# Patient Record
Sex: Male | Born: 1960 | Race: White | Hispanic: No | Marital: Single | Smoking: Current every day smoker
Health system: Southern US, Community
[De-identification: ages and names within clinical notes are randomized; demographics above are authoritative.]

## PROBLEM LIST (undated history)

## (undated) DIAGNOSIS — I219 Acute myocardial infarction, unspecified: Secondary | ICD-10-CM

---

## 2013-07-20 ENCOUNTER — Emergency Department (HOSPITAL_COMMUNITY)
Admission: EM | Admit: 2013-07-20 | Discharge: 2013-07-20 | Disposition: A | Discharge status: Home / Self Care | Payer: Worker's Compensation | Attending: Emergency Medicine | Admitting: Emergency Medicine

## 2013-07-20 ENCOUNTER — Emergency Department (HOSPITAL_COMMUNITY): Payer: Worker's Compensation

## 2013-07-20 ENCOUNTER — Encounter (HOSPITAL_COMMUNITY): Payer: Self-pay | Admitting: Emergency Medicine

## 2013-07-20 DIAGNOSIS — Y9289 Other specified places as the place of occurrence of the external cause: Secondary | ICD-10-CM | POA: Insufficient documentation

## 2013-07-20 DIAGNOSIS — F17200 Nicotine dependence, unspecified, uncomplicated: Secondary | ICD-10-CM | POA: Insufficient documentation

## 2013-07-20 DIAGNOSIS — M940 Chondrocostal junction syndrome [Tietze]: Secondary | ICD-10-CM | POA: Insufficient documentation

## 2013-07-20 DIAGNOSIS — X500XXA Overexertion from strenuous movement or load, initial encounter: Secondary | ICD-10-CM | POA: Insufficient documentation

## 2013-07-20 DIAGNOSIS — Y9389 Activity, other specified: Secondary | ICD-10-CM | POA: Insufficient documentation

## 2013-07-20 MED ORDER — NAPROXEN 500 MG PO TABS: 500.0000 mg | ORAL_TABLET | Freq: Two times a day (BID) | ORAL | Status: DC

## 2013-07-20 MED ORDER — METHOCARBAMOL 500 MG PO TABS: 500.0000 mg | ORAL_TABLET | Freq: Two times a day (BID) | ORAL | Status: DC

## 2013-07-20 NOTE — ED Notes (Signed)
Per pt sts that he lifts heavy boxes at work and since Saturday he has been having pain and swelling in left rib area. sts hurts when he moves and breathes.

## 2013-07-20 NOTE — ED Notes (Signed)
Family at bedside. 

## 2013-07-20 NOTE — ED Notes (Signed)
Declined W/C at D/C and was escorted to lobby by RN. 

## 2013-07-20 NOTE — ED Notes (Signed)
Patient transported to CT 

## 2013-07-20 NOTE — ED Provider Notes (Signed)
Medical screening examination/treatment/procedure(s) were performed by non-physician practitioner and as supervising physician I was immediately available for consultation/collaboration.   EKG Interpretation None        Blanchard Kelch, MD 07/20/13 1926

## 2013-07-20 NOTE — ED Notes (Signed)
Patient to ED with C/O left rib pain.  States that he was doing some heavy lifting at work and began having rib pain.  Patient indicate that the pain is in his left anterior chest at the 5th intercostal space.  Patient states that the pain worsens when he takes a deep breath or when he turns to the left.

## 2013-07-20 NOTE — ED Provider Notes (Signed)
CSN: [id number]     Arrival date & time 07/20/13  1407 History  This chart was scribed for non-physician practitioner, Domenic Moras, PA-C working with Blanchard Kelch, MD by Frederich Balding, ED scribe. This patient was seen in room TR07C/TR07C and the patient's care was started at 3:06 PM.   Chief Complaint  Patient presents with  . Rib Injury   The history is provided by the patient. No language interpreter was used.   HPI Comments: Carlos Haley is a 53 y.o. male who presents to the Emergency Department complaining of sudden onset left rib pain that started 2 days ago. States he was lifting heavy boxes at work when the pain started. Denies specific injury. Reports he noticed some swelling around his ribs but states it has resolved. Sitting down, palpation and deep breathing worsen the pain. Pt has taken leftover tylenol #3 from a tooth extraction and states it provided little relief. Denies cough, hemoptysis, SOB.   History reviewed. No pertinent past medical history. History reviewed. No pertinent past surgical history. History reviewed. No pertinent family history. History  Substance Use Topics  . Smoking status: Current Every Day Smoker  . Smokeless tobacco: Not on file  . Alcohol Use: Yes    Review of Systems  Respiratory: Negative for cough and shortness of breath.   Musculoskeletal:       Left rib pain.   All other systems reviewed and are negative.  Allergies  Review of patient's allergies indicates no known allergies.  Home Medications   Prior to Admission medications   Not on File   BP 102/67  Pulse 74  Temp(Src) 98.7 F (37.1 C)  Resp 18  Wt 180 lb (81.647 kg)  SpO2 98%  Physical Exam  Nursing note and vitals reviewed. Constitutional: He is oriented to person, place, and time. He appears well-developed and well-nourished. No distress.  HENT:  Head: Normocephalic and atraumatic.  Eyes: Conjunctivae and EOM are normal.  Neck: Neck supple. No tracheal  deviation present.  Cardiovascular: Normal rate, regular rhythm and normal heart sounds.   Pulmonary/Chest: Effort normal and breath sounds normal. No respiratory distress. He has no wheezes. He has no rales.  Left costochondral tenderness to palpation. No crepitus. No emphysema.  Musculoskeletal: Normal range of motion.  Lipoma to left mid back.   Neurological: He is alert and oriented to person, place, and time.  Skin: Skin is warm and dry.  Mild skin irritation noted to the anterior chest and abdomen.  Psychiatric: He has a normal mood and affect. His behavior is normal.    ED Course  Procedures (including critical care time)  DIAGNOSTIC STUDIES: Oxygen Saturation is 98% on RA, normal by my interpretation.    COORDINATION OF CARE: 3:09 PM-Discussed treatment plan which includes xray with pt at bedside and pt agreed to plan.   3:47 PM Xray of L ribs are without acute finding.  Pain likely costochondritis given his presenting history.  Doubt PE, PNA, pericarditis, or other acute emergent condition.  Pt is PERC negative.  Will d/c with NSAIDs, muscle relaxant and RICE therapy.    Labs Review Labs Reviewed - No data to display  Imaging Review Dg Ribs Unilateral W/chest Left  07/20/2013   CLINICAL DATA:  RIB INJURY  EXAM: LEFT RIBS AND CHEST - 3+ VIEW  COMPARISON:  None available  FINDINGS: The cardiac and mediastinal silhouettes are stable in size and contour, and remain within normal limits.  The lungs are normally inflated. Oblique  linear opacity within the right lung base is most consistent with atelectasis and/ scarring. No airspace consolidation, pleural effusion, or pulmonary edema is identified. There is no pneumothorax.  Metallic BB marker present within the lower left chest, presumably marking the area of injury/pain. No acute rib fracture identified.  IMPRESSION: 1. No acute rib fracture. 2. No other acute cardiopulmonary abnormality.   Electronically Signed   By: Jeannine Boga M.D.   On: 07/20/2013 15:39     EKG Interpretation None      MDM   Final diagnoses:  Costochondritis, acute    BP 102/67  Pulse 74  Temp(Src) 98.7 F (37.1 C)  Resp 18  Wt 180 lb (81.647 kg)  SpO2 98%  I have reviewed nursing notes and vital signs. I personally reviewed the imaging tests through PACS system  I reviewed available ER/hospitalization records thought the EMR   I personally performed the services described in this documentation, which was scribed in my presence. The recorded information has been reviewed and is accurate.  Domenic Moras, PA-C 07/20/13 1550

## 2013-07-20 NOTE — Discharge Instructions (Signed)
Costochondritis Costochondritis, sometimes called Tietze syndrome, is a swelling and irritation (inflammation) of the tissue (cartilage) that connects your ribs with your breastbone (sternum). It causes pain in the chest and rib area. Costochondritis usually goes away on its own over time. It can take up to 6 weeks or longer to get better, especially if you are unable to limit your activities. CAUSES  Some cases of costochondritis have no known cause. Possible causes include:  Injury (trauma).  Exercise or activity such as lifting.  Severe coughing. SIGNS AND SYMPTOMS  Pain and tenderness in the chest and rib area.  Pain that gets worse when coughing or taking deep breaths.  Pain that gets worse with specific movements. DIAGNOSIS  Your health care provider will do a physical exam and ask about your symptoms. Chest X-rays or other tests may be done to rule out other problems. TREATMENT  Costochondritis usually goes away on its own over time. Your health care provider may prescribe medicine to help relieve pain. HOME CARE INSTRUCTIONS   Avoid exhausting physical activity. Try not to strain your ribs during normal activity. This would include any activities using chest, abdominal, and side muscles, especially if heavy weights are used.  Apply ice to the affected area for the first 2 days after the pain begins.  Put ice in a plastic bag.  Place a towel between your skin and the bag.  Leave the ice on for 20 minutes, 2-3 times a day.  Only take over-the-counter or prescription medicines as directed by your health care provider. SEEK MEDICAL CARE IF:  You have redness or swelling at the rib joints. These are signs of infection.  Your pain does not go away despite rest or medicine. SEEK IMMEDIATE MEDICAL CARE IF:   Your pain increases or you are very uncomfortable.  You have shortness of breath or difficulty breathing.  You cough up blood.  You have worse chest pains,  sweating, or vomiting.  You have a fever or persistent symptoms for more than 2-3 days.  You have a fever and your symptoms suddenly get worse. MAKE SURE YOU:   Understand these instructions.  Will watch your condition.  Will get help right away if you are not doing well or get worse. Document Released: 10/25/2004 Document Revised: 11/05/2012 Document Reviewed: 08/19/2012 [facility] Patient Information 2015 [place], [place]. This information is not intended to replace advice given to you by your health care provider. Make sure you discuss any questions you have with your health care provider.   Emergency Department Resource Guide 1) Find a Doctor and Pay Out of Pocket Although you won't have to find out who is covered by your insurance plan, it is a good idea to ask around and get recommendations. You will then need to call the office and see if the doctor you have chosen will accept you as a new patient and what types of options they offer for patients who are self-pay. Some doctors offer discounts or will set up payment plans for their patients who do not have insurance, but you will need to ask so you aren't surprised when you get to your appointment.  2) Contact Your Local Health Department Not all health departments have doctors that can see patients for sick visits, but many do, so it is worth a call to see if yours does. If you don't know where your local health department is, you can check in your phone book. The CDC also has a tool to help you  locate your state's health department, and many state websites also have listings of all of their local health departments.  3) Find a [facility] If your illness is not likely to be very severe or complicated, you may want to try a walk in clinic. These are popping up all over the country in pharmacies, drugstores, and shopping centers. They're usually staffed by nurse practitioners or physician assistants that have been trained to treat  common illnesses and complaints. They're usually fairly quick and inexpensive. However, if you have serious medical issues or chronic medical problems, these are probably not your best option.  No Primary Care Doctor: - Call Health Connect at  [phone] - they can help you locate a primary care doctor that  accepts your insurance, provides certain services, etc. - Physician Referral Service- [phone]  Chronic Pain Problems: Organization         Address  Phone   Notes  [facility]  [phone] Patients need to be referred by their primary care doctor.   Medication Assistance: Organization         Address  Phone   Notes  [facility] Medication [facility] [address] [place], [place] [address] [phone] --Must be a resident of [facility] -- Must have NO insurance coverage whatsoever (no Medicaid/ Medicare, etc.) -- The pt. MUST have a primary care doctor that directs their care regularly and follows them in the community   [facility]ist  [phone]   [employer]  [phone]    Agencies that provide inexpensive medical care: Organization         Address  Phone   Notes  [place]  [phone]   [facility]    [phone]   [facility] [place], [place] [address] [phone]   [place] [address], [place] [phone]   Planned Parenthood    [phone]   [facility]    [phone]   [place] and [place] [address], [place] Phone:  [phone], Fax:  [phone] Hours of Operation:  9 am - 6 pm, M-F.  Also accepts Medicaid/Medicare and self-pay.  [facility] for [address], [address], [place] Phone: [phone], Fax: [phone]. Hours of Operation:  8:30 am - 5:30 pm, M-F.  Also accepts Medicaid and self-pay.  [facility] [address], [place] Phone: [phone]   [place], [place], [place] [phone], Ext. [phone] Mondays & Thursdays: 7-9 AM.  First 15 patients are seen on a first come, first serve basis.    [place] Providers:  Organization         Address  Phone   Notes  [facility] [address], [place] [phone] Also accepts self-pay patients.  [facility] [address], [place]  [phone]   [place], [address], [place] [phone]   [facility] [address] [place] [phone]   Lucianne Lei [address], [place]   [phone] Only accepts [place] Access [place] patients after they have their name applied to their card.   Self-Pay (no insurance) in [facility]:  Organization  Address  Phone   Notes  Sickle Cell Patients, [facility] [place] [phone]   [facility] [place] [phone]   [facility] [place]  [place], [address], [place] [phone]   [facility]/Dr. Osei-Bonsu  [address] [place] or [address], [place] [phone] Phone number for both [place] and [place] locations is the same.  [facility]gent [facility] and [facility] [address], [place] [phone]   [facility] [address] [place] or [address] [phone]   [facility] [address] [place] [phone], phone; [phone], fax Sees patients 1st and 3rd Saturday of every month.  Must not qualify for public or private insurance (i.e. Medicaid, Medicare, [place] Health Choice, Veterans' Benefits)  Household income should be no more than 200% of the  poverty level The clinic cannot treat you if you are pregnant or think you are pregnant  Sexually transmitted diseases are not treated at the clinic.    Dental Care: Organization         Address  Phone  Notes  [facility] Department of [facility] [place] [phone] Accepts children up to age 61 who are enrolled in [place] or [place]; pregnant women with a Medicaid card; and children who have applied for Medicaid or [place] Health Choice, but were declined, whose parents can pay a reduced fee at time of service.  [facility] Department of [facility]  [address], [place] [phone] Accepts children up to age 65 who are enrolled in [place] or [place]; pregnant women with a Medicaid card; and children who have applied for Medicaid or [place] Health Choice, but were declined, whose parents can pay a reduced fee at time of service.  [place] Adult Dental Access PROGRAM  [place] [phone] Patients are seen by appointment only. Walk-ins are not accepted. [place] will see patients 42 years of age and older. Monday - Tuesday (8am-5pm) Most Wednesdays (8:30-5pm) $30 per visit, cash only  [facility] Adult Dental Access PROGRAM  [address], [facility] [phone] Patients are seen by appointment only. Walk-ins are not accepted. [place] will see patients 31 years of age and older. One Wednesday Evening (Monthly: Volunteer Based).  $30 per visit, cash only  [place]  [phone] for adults; Children under age 49, call Graduate Pediatric Dentistry at [phone]. Children aged 28-14, please call [phone] to request a pediatric application.  Dental services are provided in all areas of dental care including fillings, crowns and bridges, complete and partial dentures, implants, gum treatment, root canals, and extractions.  Preventive care is also provided. Treatment is provided to both adults and children. Patients are selected via a lottery and there is often a waiting list.   [facility] [address], [employer]  [phone] [email or web address]   [facility] [address] [place], [place] [phone], Ext. [phone] Second and Fourth Thursday of each month, opens at 6:30 AM; Clinic ends at 9 AM.  Patients are seen on a first-come first-served basis, and a limited number are seen during each clinic.   [facility]  [address] [place], [place]  [place], [place] [phone]   Eligibility Requirements You must have lived in [place], [place], or [place] counties for at least the last three months.   You cannot be eligible for state or federal sponsored [employer], including [employer], [place], or [employer].   You generally cannot be eligible for healthcare insurance through your employer.    How to apply: Eligibility screenings are held every Tuesday and Wednesday afternoon from 1:00 pm until 4:00 pm. You do not need an appointment for the interview!  [facility] [address], [place], [place]   [place]  [place] Department  [place]  [phone]    Behavioral Health Resources in the Community: Intensive Outpatient Programs Organization         Address  Phone  Notes  [place]. [address], [place], [place] [phone]   [facility] [address] [place], [place]   ADS: [facility] Drug [facility] [address] [place], [place]   [place] [address]  [place], [place] or [phone]   Substance Abuse Resources Organization         Address  Phone  Notes  Alcohol and Drug Services  [phone]   [place]  [phone]   [facility]   Chinita Pester  [phone]   Residential & Outpatient Substance Abuse Program  [phone]   Psychological Services Organization         Address  Phone  Notes  [facility] [place]  [place]  [phone]   [place] [address], [place] or [phone]    Mobile Crisis Teams Organization         Address  Phone  Notes  [facility], Mobile Crisis Care Unit  [phone]   [facility]  [address] [place], [place]   [place] [address], [place] [phone]    Self-Help/Support Groups Organization         Address  Phone             Notes  [place]. of [place] - variety of support groups  [place] Call for more information  Narcotics Anonymous (NA), [facility] [address], [employer] [place]  2 meetings at this location   Special educational needs teacher         Address  Phone  Notes  [facility]AP Residential Treatment [place],    [place]  [phone]-([phone]   [facility]  [address] [place] [address], [place], [place]   [place], [place] [phone] Admissions: 8am-3pm M-F  Incentives Substance [place] [address]    [place], [place] [phone]   [facility] [address] [place] [address] [place], [place]   [facility] [address],  [place], [place]   [facility] - Intensive Outpatient [address], [place], [place]   [facility] ([place].) [address].,  [place], [place] or [phone]   [facility] Services (RTS) [address] [place], [place] Accepts Medicaid  [facility]ship [place] [address]  [place]  [phone] Substance Abuse/Addiction Treatment   [facility] Resources Organization  Address  Phone  Notes  °[facility]  ([phone]) [phone]   °Julie Brannon, PhD [address] [place], [place]   ([phone]) [phone] or (336) 951-0000   °[facility]   [address] °[place], [place] ([phone]) [phone]   °[facility] [address], [place], [place] ([phone]) [phone] Insurance/Medicaid/sponsorship through [facility]  °[facility] [address]                                    [place], [place] [phone] Therapy/tele-psych/case  °[facility] [address]  ° [place], [place] ([phone]) [phone]    °Dr. Arfeen  ([phone]   °[facility] [place]  [employer] [place] [facility] 1) [address], [place] °2) [address], [place] °3)  [address], [place] ([phone]) [phone] °(336) [phone] ° °([phone]) [phone]   °[place] Child Abuse Hotline (336) 342-1394 or (336) [phone] (After Hours)    ° ° ° °

## 2015-10-18 ENCOUNTER — Encounter (HOSPITAL_COMMUNITY): Payer: Self-pay | Admitting: Emergency Medicine

## 2015-10-18 DIAGNOSIS — F17200 Nicotine dependence, unspecified, uncomplicated: Secondary | ICD-10-CM | POA: Diagnosis not present

## 2015-10-18 DIAGNOSIS — R21 Rash and other nonspecific skin eruption: Secondary | ICD-10-CM | POA: Diagnosis not present

## 2015-10-18 NOTE — ED Triage Notes (Signed)
Pt reports issues with flea and lice infestation at home, states treated hair and had house sprays, but noticed more bites to R forearm tonight. States tried everything but no relief.

## 2015-10-19 ENCOUNTER — Emergency Department (HOSPITAL_COMMUNITY)
Admission: EM | Admit: 2015-10-19 | Discharge: 2015-10-19 | Disposition: A | Discharge status: Home / Self Care | Payer: Managed Care, Other (non HMO) | Attending: Emergency Medicine | Admitting: Emergency Medicine

## 2015-10-19 DIAGNOSIS — R21 Rash and other nonspecific skin eruption: Secondary | ICD-10-CM

## 2015-10-19 MED ORDER — LORATADINE 10 MG PO TABS: 10.0000 mg | ORAL_TABLET | Freq: Every day | ORAL | 0 refills | Status: DC

## 2015-10-19 MED ORDER — TRIAMCINOLONE ACETONIDE 0.1 % EX CREA: 1.0000 "application " | TOPICAL_CREAM | Freq: Two times a day (BID) | CUTANEOUS | 0 refills | Status: AC

## 2015-10-19 NOTE — ED Provider Notes (Signed)
[facility] DEPT Provider Note   CSN: [id number] Arrival date & time: 10/18/15  2259     History   Chief Complaint Chief Complaint  Patient presents with  . Insect Bite    HPI Carlos Haley is a 55 y.o. male.  Carlos Haley is a 55 y.o. male presents to ED with complaint of rash. Patient reports issues with flea and lice infestation at home recently. He states the house was sprayed, dogs treated for fleas, bedding/clothing washed, and completed OTC lice treatment. He reports today to ED with white spots on his forearms b/l with associated itching and a sensation of "stuff moving" on his arms, onset today. Pt reports he has tried tea tree oil and alcohol without relief. Denies fever, purulent discharge, oral lesions, difficulty swallowing, shortness of breath, chest pain, N/V, or myalgias. No recent changes in lotion, soaps, or detergents. No recent new medications or medication changes. Reports girlfriend has similar sxs.       History reviewed. No pertinent past medical history.  There are no active problems to display for this patient.   History reviewed. No pertinent surgical history.     Home Medications    Prior to Admission medications   Medication Sig Start Date End Date Taking? Authorizing Provider  loratadine (CLARITIN) 10 MG tablet Take 1 tablet (10 mg total) by mouth daily. 10/19/15   Roxanna Mew, PA-C  methocarbamol (ROBAXIN) 500 MG tablet Take 1 tablet (500 mg total) by mouth 2 (two) times daily. 07/20/13   Domenic Moras, PA-C  naproxen (NAPROSYN) 500 MG tablet Take 1 tablet (500 mg total) by mouth 2 (two) times daily. 07/20/13   Domenic Moras, PA-C  triamcinolone cream (KENALOG) 0.1 % Apply 1 application topically 2 (two) times daily. To forearms as needed for itch relief. 10/19/15   Roxanna Mew, PA-C    Family History No family history on file.  Social History Social History  Substance Use Topics  . Smoking status: Current Every Day Smoker  .  Smokeless tobacco: Never Used  . Alcohol use Yes     Allergies   Review of patient's allergies indicates no known allergies.   Review of Systems Review of Systems  Constitutional: Negative for fever.  HENT: Negative for trouble swallowing.   Respiratory: Negative for shortness of breath.   Cardiovascular: Negative for chest pain.  Gastrointestinal: Negative for nausea and vomiting.  Musculoskeletal: Negative for myalgias.  Skin: Positive for rash.     Physical Exam Updated Vital Signs BP 131/85 (BP Location: Left Arm)   Pulse (!) 58   Temp 99.1 F (37.3 C) (Oral)   Resp 18   Ht 5\' 9"  (1.753 m)   Wt 81.6 kg   SpO2 98%   BMI 26.58 kg/m   Physical Exam  Constitutional: He appears well-developed and well-nourished. No distress.  HENT:  Head: Normocephalic and atraumatic.  Mouth/Throat: Uvula is midline, oropharynx is clear and moist and mucous membranes are normal. No oral lesions. No trismus in the jaw. No oropharyngeal exudate. No tonsillar exudate.  No trismus. No oral lesions. Managing oral secretions.   Eyes: Conjunctivae are normal. Pupils are equal, round, and reactive to light. Right eye exhibits no discharge. Left eye exhibits no discharge. No scleral icterus.  Neck: Normal range of motion. Neck supple.  Cardiovascular: Normal rate and intact distal pulses.   Pulmonary/Chest: Effort normal and breath sounds normal. No stridor. No respiratory distress. He has no wheezes. He has no rales.  Abdominal: He  exhibits no distension.  Neurological: He is alert.  Skin: Skin is warm and dry. He is not diaphoretic.  Mild dry, scaling skin noted to forearms b/l. No obvious insects or bugs on skin. No vesicles or papules appreciated. No burrows noted in interdigit web spaces. No lice appreciated in hair.   Psychiatric: He has a normal mood and affect. His behavior is normal.     ED Treatments / Results  Labs (all labs ordered are listed, but only abnormal results are  displayed) Labs Reviewed - No data to display  EKG  EKG Interpretation None       Radiology No results found.  Procedures Procedures (including critical care time)  Medications Ordered in ED Medications - No data to display   Initial Impression / Assessment and Plan / ED Course  I have reviewed the triage vital signs and the nursing notes.  Pertinent labs & imaging results that were available during my care of the patient were reviewed by me and considered in my medical decision making (see chart for details).  Clinical Course    Patient presents to ED with complaint of rash. Patient is afebrile and non-toxic appearing in NAD. VSS. Mild dry, scaling skin noted to forearms b/l. No insects/bugs, papules, vesicles, or burrows appreciated. No signs of secondary skin infection. Discussed plan with pt. Rx triamcinolone cream and zyrtec for symptomatic relief. Continue home treatments for possible insect infestations. Follow up with PCP if sxs persist for referral to dermatology. Return precautions given. Pt voiced understanding and is agreeable.    Final Clinical Impressions(s) / ED Diagnoses   Final diagnoses:  Rash    New Prescriptions Discharge Medication List as of 10/19/2015  2:27 AM    START taking these medications   Details  loratadine (CLARITIN) 10 MG tablet Take 1 tablet (10 mg total) by mouth daily., Starting Wed 10/19/2015, Print    triamcinolone cream (KENALOG) 0.1 % Apply 1 application topically 2 (two) times daily. To forearms as needed for itch relief., Starting Wed 10/19/2015, Print         [place], [place] 10/20/15 [place], MD 10/26/15 0830

## 2015-10-19 NOTE — Discharge Instructions (Signed)
Read the information below.  I prescribed loratadine and triamcinolone cream for itch relief. Use as directed. Wash clothing and bedding.  Use the prescribed medication as directed.  Please discuss all new medications with your pharmacist.   I have provided the contact information for [facility], please call to establish a primary doctor.  You may return to the Emergency Department at any time for worsening condition or any new symptoms that concern you. Return to ED if you develop redness, streaking, purulent discharge, fever, vomiting, difficulty swallowing, or difficulty breathing.

## 2016-05-27 ENCOUNTER — Emergency Department (HOSPITAL_COMMUNITY)
Admission: EM | Admit: 2016-05-27 | Discharge: 2016-05-28 | Disposition: A | Discharge status: Home / Self Care | Payer: 59 | Attending: Emergency Medicine | Admitting: Emergency Medicine

## 2016-05-27 ENCOUNTER — Emergency Department (HOSPITAL_COMMUNITY): Payer: 59

## 2016-05-27 ENCOUNTER — Encounter (HOSPITAL_COMMUNITY): Payer: Self-pay | Admitting: Emergency Medicine

## 2016-05-27 DIAGNOSIS — F17210 Nicotine dependence, cigarettes, uncomplicated: Secondary | ICD-10-CM | POA: Insufficient documentation

## 2016-05-27 DIAGNOSIS — I252 Old myocardial infarction: Secondary | ICD-10-CM | POA: Diagnosis not present

## 2016-05-27 DIAGNOSIS — R079 Chest pain, unspecified: Secondary | ICD-10-CM | POA: Diagnosis present

## 2016-05-27 DIAGNOSIS — R0689 Other abnormalities of breathing: Secondary | ICD-10-CM | POA: Insufficient documentation

## 2016-05-27 DIAGNOSIS — R0789 Other chest pain: Secondary | ICD-10-CM | POA: Diagnosis not present

## 2016-05-27 HISTORY — DX: Acute myocardial infarction, unspecified: I21.9

## 2016-05-27 LAB — CBC
HCT: 44.5 % (ref 39.0–52.0)
Hemoglobin: 15 g/dL (ref 13.0–17.0)
MCH: 33 pg (ref 26.0–34.0)
MCHC: 33.7 g/dL (ref 30.0–36.0)
MCV: 97.8 fL (ref 78.0–100.0)
Platelets: 394 10*3/uL (ref 150–400)
RBC: 4.55 MIL/uL (ref 4.22–5.81)
RDW: 13.8 % (ref 11.5–15.5)
WBC: 12.1 10*3/uL — ABNORMAL HIGH (ref 4.0–10.5)

## 2016-05-27 LAB — I-STAT TROPONIN, ED: Troponin i, poc: 0.01 ng/mL (ref 0.00–0.08)

## 2016-05-27 LAB — BASIC METABOLIC PANEL
Anion gap: 10 (ref 5–15)
BUN: <5 mg/dL — ABNORMAL LOW (ref 6–20)
CHLORIDE: 100 mmol/L — AB (ref 101–111)
CO2: 24 mmol/L (ref 22–32)
Calcium: 8.7 mg/dL — ABNORMAL LOW (ref 8.9–10.3)
Creatinine, Ser: 0.88 mg/dL (ref 0.61–1.24)
GFR calc non Af Amer: >60 mL/min (ref >60)
Glucose, Bld: 105 mg/dL — ABNORMAL HIGH (ref 65–99)
Potassium: 3.9 mmol/L (ref 3.5–5.1)
Sodium: 134 mmol/L — ABNORMAL LOW (ref 135–145)

## 2016-05-27 NOTE — ED Notes (Signed)
Patient transported to X-ray 

## 2016-05-27 NOTE — ED Triage Notes (Signed)
Pt c/o L chest pain/rib non radiating, sharp, intermittent, emesis x 3, +dizziness, chills. Onset 1800 tonight. Pt states before pain began he felt as if he had difficulty swallowing "like I had something caught in throat. Pt did have MI in 2010.

## 2016-05-28 LAB — TSH: TSH: 1.013 u[IU]/mL (ref 0.350–4.500)

## 2016-05-28 LAB — HEPATIC FUNCTION PANEL
ALBUMIN: 4 g/dL (ref 3.5–5.0)
ALT: 17 U/L (ref 17–63)
AST: 31 U/L (ref 15–41)
Alkaline Phosphatase: 71 U/L (ref 38–126)
Bilirubin, Direct: <0.1 mg/dL — ABNORMAL LOW (ref 0.1–0.5)
TOTAL PROTEIN: 6.2 g/dL — AB (ref 6.5–8.1)
Total Bilirubin: 0.4 mg/dL (ref 0.3–1.2)

## 2016-05-28 LAB — I-STAT TROPONIN, ED: TROPONIN I, POC: 0 ng/mL (ref 0.00–0.08)

## 2016-05-28 LAB — BRAIN NATRIURETIC PEPTIDE: B NATRIURETIC PEPTIDE 5: 12.1 pg/mL (ref 0.0–100.0)

## 2016-05-28 LAB — LIPASE, BLOOD: Lipase: 19 U/L (ref 11–51)

## 2016-05-28 MED ORDER — FAMOTIDINE 20 MG PO TABS: 20.0000 mg | ORAL_TABLET | Freq: Two times a day (BID) | ORAL | 0 refills | Status: AC

## 2016-05-28 MED ORDER — GI COCKTAIL ~~LOC~~
30.0000 mL | Freq: Once | ORAL | Status: AC
  Administered 2016-05-28: 30 mL via ORAL
  Filled 2016-05-28: qty 30

## 2016-05-28 NOTE — ED Provider Notes (Signed)
[facility] DEPT Provider Note   CSN: [id number] Arrival date & time: 05/27/16  2303     History   Chief Complaint Chief Complaint  Patient presents with  . Chest Pain    HPI Carlos Haley is a 56 y.o. male.  HPI   Pt with hx MI p/w multiple complaints.    He came to ED c/o left chest pain that is sharp, momentary, came and went.  This occurred in the setting of multiple other chronic symptoms.  He did vomit, white foam, just prior to the onset of chest pain.  He also vomited once at the same time yesterday without chest pain.  Does feel like something is stuck in his throat.  Denies abdominal pain, SOB.  No recent med changes.    Has had problems for the past 6 months including general weakness all the time, not sleeping well, 25 lb weight loss over 3 months despite eating well, daily headaches, breathing problems, nausea, hair falling out, difficulty healing skin lesions, leg swelling.  States all of these symptoms occur at night, starting around 6pm.  By morning he feels better.    No new sexual partners.  He and girlfriend have had testing for HIV/STDs.    PCP Ephraim Hamburger.   Past Medical History:  Diagnosis Date  . MI (myocardial infarction) ([place])     There are no active problems to display for this patient.   History reviewed. No pertinent surgical history.     Home Medications    Prior to Admission medications   Medication Sig Start Date End Date Taking? Authorizing Provider  cholecalciferol (VITAMIN D) 1000 units tablet Take 1,000 Units by mouth daily.   Yes Historical Provider, MD  Multiple Vitamin (MULTIVITAMIN WITH MINERALS) TABS tablet Take 1 tablet by mouth daily.   Yes Historical Provider, MD  triamcinolone cream (KENALOG) 0.1 % Apply 1 application topically 2 (two) times daily. To forearms as needed for itch relief. 10/19/15  Yes Frederica Kuster, PA-C  vitamin B-12 (CYANOCOBALAMIN) 1000 MCG tablet Take 500 mcg by mouth daily.   Yes Historical  Provider, MD  famotidine (PEPCID) 20 MG tablet Take 1 tablet (20 mg total) by mouth 2 (two) times daily. Acid reflux 05/28/16   Clayton Bibles, PA-C    Family History No family history on file.  Social History Social History  Substance Use Topics  . Smoking status: Current Every Day Smoker    Types: Cigarettes  . Smokeless tobacco: Never Used  . Alcohol use Yes     Comment: occ     Allergies   Patient has no known allergies.   Review of Systems Review of Systems  All other systems reviewed and are negative.    Physical Exam Updated Vital Signs BP 120/83 (BP Location: Left Arm)   Pulse 70   Temp 98.7 F (37.1 C) (Oral)   Resp 16   Ht 5\' 9"  (1.753 m)   Wt 76.2 kg   SpO2 97%   BMI 24.81 kg/m   Physical Exam  Constitutional: He appears well-developed and well-nourished. No distress.  HENT:  Head: Normocephalic and atraumatic.  Neck: Neck supple.  Cardiovascular: Normal rate and regular rhythm.   Pulmonary/Chest: Effort normal and breath sounds normal. No respiratory distress. He has no wheezes. He has no rales.  Abdominal: Soft. He exhibits no distension and no mass. There is no rebound and no guarding.  Musculoskeletal: He exhibits no edema.  Neurological: He is alert. He exhibits normal muscle  tone.  Skin: He is not diaphoretic.  Nursing note and vitals reviewed.    ED Treatments / Results  Labs (all labs ordered are listed, but only abnormal results are displayed) Labs Reviewed  BASIC METABOLIC PANEL - Abnormal; Notable for the following:       Result Value   Sodium 134 (*)    Chloride 100 (*)    Glucose, Bld 105 (*)    BUN <5 (*)    Calcium 8.7 (*)    All other components within normal limits  CBC - Abnormal; Notable for the following:    WBC 12.1 (*)    All other components within normal limits  HEPATIC FUNCTION PANEL - Abnormal; Notable for the following:    Total Protein 6.2 (*)    Bilirubin, Direct <0.1 (*)    All other components within normal  limits  LIPASE, BLOOD  BRAIN NATRIURETIC PEPTIDE  TSH  I-STAT TROPOININ, ED  I-STAT TROPOININ, ED    EKG  EKG Interpretation  Date/Time:  Sunday May 27 2016 23:11:01 EDT Ventricular Rate:  100 PR Interval:  156 QRS Duration: 84 QT Interval:  348 QTC Calculation: 448 R Axis:   83 Text Interpretation:  Normal sinus rhythm Normal ECG Confirmed by DELO  MD, DOUGLAS ([id number]) on 05/28/2016 1:37:19 AM       Radiology Dg Chest 2 View  Result Date: 05/28/2016 CLINICAL DATA:  Left-sided chest pain EXAM: CHEST  2 VIEW COMPARISON:  07/20/2013 FINDINGS: The heart size and mediastinal contours are within normal limits. Both lungs are clear. The visualized skeletal structures are unremarkable. IMPRESSION: No active cardiopulmonary disease. Electronically Signed   By: Donavan Foil M.D.   On: 05/28/2016 00:05    Procedures Procedures (including critical care time)  Medications Ordered in ED Medications  gi cocktail (Maalox,Lidocaine,Donnatal) (30 mLs Oral Given 05/28/16 0136)     Initial Impression / Assessment and Plan / ED Course  I have reviewed the triage vital signs and the nursing notes.  Pertinent labs & imaging results that were available during my care of the patient were reviewed by me and considered in my medical decision making (see chart for details).  Clinical Course as of May 29 514  Mon May 28, 2016  0255 Pt reports he is feeling better.  GI cocktail helped.    [EW]    Clinical Course User Index [EW] Clayton Bibles, PA-C   Afebrile, nontoxic patient with many chronic symptoms ongoing since August.  He has many symptoms that occur only at night and he feels better every morning.  No change in work, has changed homes since it started.  Denies any known exposures otherwise.  Works at a [employer], chart[employer] but denies concerning chemical exposures.  He came in tonight for episode of chest pain.  This chest pain was sharp and momentary and occurred after he vomited.  It is very  atypical and I doubt ACS.  Troponin x 2 negative.  Workup did not suggest etiology of his many symptoms. Discussed pt , workup, and plan with Dr Stark Jock.  Pt advised close PCP follow up.  Discussed result, findings, treatment, and follow up  with patient.  Pt given return precautions.  Pt verbalizes understanding and agrees with plan.      I doubt any other EMC precluding discharge at this time including, but not necessarily limited to the following:  ACS, PE, aortic dissection, pneuothorax, pneumonia.     Final Clinical Impressions(s) / ED Diagnoses   Final  diagnoses:  Atypical chest pain    New Prescriptions Discharge Medication List as of 05/28/2016  2:57 AM    START taking these medications   Details  famotidine (PEPCID) 20 MG tablet Take 1 tablet (20 mg total) by mouth 2 (two) times daily. Acid reflux, Starting Mon 05/28/2016, Print         [place], [place] 05/28/16 [place], MD 05/28/16 [phone]

## 2016-05-28 NOTE — Discharge Instructions (Signed)
Read the information below.  You may return to the Emergency Department at any time for worsening condition or any new symptoms that concern you.  If you develop worsening chest pain, shortness of breath, fever, you pass out, or become weak or dizzy, return to the ER for a recheck.

## 2016-05-28 NOTE — ED Notes (Signed)
Pt departed in NAD, refused use of wheelchair.  

## 2016-12-23 ENCOUNTER — Encounter (HOSPITAL_COMMUNITY): Payer: Self-pay | Admitting: *Deleted

## 2016-12-23 ENCOUNTER — Emergency Department (HOSPITAL_COMMUNITY)
Admission: EM | Admit: 2016-12-23 | Discharge: 2016-12-23 | Disposition: A | Discharge status: Home / Self Care | Payer: 59 | Attending: Emergency Medicine | Admitting: Emergency Medicine

## 2016-12-23 DIAGNOSIS — R0602 Shortness of breath: Secondary | ICD-10-CM | POA: Diagnosis present

## 2016-12-23 DIAGNOSIS — I252 Old myocardial infarction: Secondary | ICD-10-CM | POA: Insufficient documentation

## 2016-12-23 DIAGNOSIS — R21 Rash and other nonspecific skin eruption: Secondary | ICD-10-CM | POA: Diagnosis not present

## 2016-12-23 DIAGNOSIS — Z79899 Other long term (current) drug therapy: Secondary | ICD-10-CM | POA: Diagnosis not present

## 2016-12-23 DIAGNOSIS — F17210 Nicotine dependence, cigarettes, uncomplicated: Secondary | ICD-10-CM | POA: Insufficient documentation

## 2016-12-23 MED ORDER — FAMOTIDINE 20 MG PO TABS
20.0000 mg | ORAL_TABLET | Freq: Once | ORAL | Status: AC
  Administered 2016-12-23: 20 mg via ORAL
  Filled 2016-12-23: qty 1

## 2016-12-23 MED ORDER — PREDNISONE 20 MG PO TABS: ORAL_TABLET | ORAL | 0 refills | Status: AC

## 2016-12-23 MED ORDER — DIPHENHYDRAMINE HCL 25 MG PO CAPS
25.0000 mg | ORAL_CAPSULE | Freq: Once | ORAL | Status: AC
  Administered 2016-12-23: 25 mg via ORAL
  Filled 2016-12-23: qty 1

## 2016-12-23 MED ORDER — METHYLPREDNISOLONE SODIUM SUCC 125 MG IJ SOLR
125.0000 mg | Freq: Once | INTRAMUSCULAR | Status: AC
  Administered 2016-12-23: 125 mg via INTRAMUSCULAR
  Filled 2016-12-23: qty 2

## 2016-12-23 NOTE — ED Triage Notes (Signed)
Pt reports waking up this am with redness and feeling flushed. Has not taken any meds pta. VSS. No acute distress is noted.

## 2016-12-23 NOTE — ED Notes (Signed)
Declined W/C at D/C and was escorted to lobby by RN. 

## 2016-12-23 NOTE — ED Provider Notes (Signed)
[facility] EMERGENCY DEPARTMENT Provider Note   CSN: [id number] Arrival date & time: 12/23/16  1532     History   Chief Complaint Chief Complaint  Patient presents with  . Rash    HPI Carlos Haley is a 56 y.o. male.  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  States he woke up this morning with some sensation of throat swelling and difficulty breathing.  He noticed a facial rash which has progressed throughout the day.  Denies any itching to the rash but states it feels warm.  Currently denies any trouble breathing.  Denies rash on any other part of his body.  Thinks he may been exposed to chemical at work.  Denies any other medications or new exposures. Past Medical History:  Diagnosis Date  . MI (myocardial infarction) ([place])     There are no active problems to display for this patient.   History reviewed. No pertinent surgical history.     Home Medications    Prior to Admission medications   Medication Sig Start Date End Date Taking? Authorizing Provider  cholecalciferol (VITAMIN D) 1000 units tablet Take 1,000 Units by mouth daily.    [provider]  famotidine (PEPCID) 20 MG tablet Take 1 tablet (20 mg total) by mouth 2 (two) times daily. Acid reflux 05/28/16   Clayton Bibles, PA-C  Multiple Vitamin (MULTIVITAMIN WITH MINERALS) TABS tablet Take 1 tablet by mouth daily.    [provider]  predniSONE (DELTASONE) 20 MG tablet 3 tabs po day one, then 2 po daily x 4 days 12/24/16   Julianne Rice, MD  triamcinolone cream (KENALOG) 0.1 % Apply 1 application topically 2 (two) times daily. To forearms as needed for itch relief. 10/19/15   Frederica Kuster, PA-C  vitamin B-12 (CYANOCOBALAMIN) 1000 MCG tablet Take 500 mcg by mouth daily.    [provider]    Family History History reviewed. No pertinent family history.  Social History Social History   Tobacco Use  .  Smoking status: Current Every Day Smoker    Types: Cigarettes  . Smokeless tobacco: Never Used  Substance Use Topics  . Alcohol use: Yes    Comment: occ  . Drug use: No     Allergies   Patient has no known allergies.   Review of Systems Review of Systems  Constitutional: Negative for chills and fever.  HENT: Negative for facial swelling, trouble swallowing and voice change.   Eyes: Negative for pain, redness, itching and visual disturbance.  Respiratory: Positive for shortness of breath. Negative for cough and wheezing.   Cardiovascular: Negative for chest pain, palpitations and leg swelling.  Gastrointestinal: Negative for abdominal pain, constipation, diarrhea, nausea and vomiting.  Musculoskeletal: Negative for myalgias, neck pain and neck stiffness.  Skin: Positive for rash. Negative for wound.  Neurological: Negative for weakness, light-headedness, numbness and headaches.  All other systems reviewed and are negative.    Physical Exam Updated Vital Signs BP (!) 137/92 (BP Location: Right Arm)   Pulse 80   Temp 98.1 F (36.7 C) (Oral)   Resp 16   SpO2 98%   Physical Exam  Constitutional: He is oriented to person, place, and time. He appears well-developed and well-nourished. No distress.  HENT:  Head: Normocephalic and atraumatic.  Mouth/Throat: Oropharynx is clear and moist. No oropharyngeal exudate.  Patient with erythematous macular nonblanching rash to the face but spares the scalp and periorbital  region.  Eyes: EOM are normal. Pupils are equal, round, and reactive to light.  Neck: Normal range of motion. Neck supple.  No stridor.  Trachea is midline.  Cardiovascular: Normal rate and regular rhythm.  Pulmonary/Chest: Effort normal and breath sounds normal. No stridor. No respiratory distress. He has no wheezes. He has no rales. He exhibits no tenderness.  Abdominal: Soft. Bowel sounds are normal. There is no tenderness. There is no rebound and no guarding.    Musculoskeletal: Normal range of motion. He exhibits no edema or tenderness.  Neurological: He is alert and oriented to person, place, and time.  Skin: Skin is warm and dry. Capillary refill takes less than 2 seconds. No rash noted. No erythema.  Rashes as previous described of the face.  No other rash noted.  Psychiatric: He has a normal mood and affect. His behavior is normal.  Nursing note and vitals reviewed.    ED Treatments / Results  Labs (all labs ordered are listed, but only abnormal results are displayed) Labs Reviewed - No data to display  EKG  EKG Interpretation None       Radiology No results found.  Procedures Procedures (including critical care time)  Medications Ordered in ED Medications  methylPREDNISolone sodium succinate (SOLU-MEDROL) 125 mg/2 mL injection 125 mg (125 mg Intramuscular Given 12/23/16 1647)  diphenhydrAMINE (BENADRYL) capsule 25 mg (25 mg Oral Given 12/23/16 1647)  famotidine (PEPCID) tablet 20 mg (20 mg Oral Given 12/23/16 1647)     Initial Impression / Assessment and Plan / ED Course  I have reviewed the triage vital signs and the nursing notes.  Pertinent labs & imaging results that were available during my care of the patient were reviewed by me and considered in my medical decision making (see chart for details).     Concern for possible dermatitis due to chemical exposure.  Airways patent. Redness is improved.  Airway continues to be patent.  Discharge home with short course of steroids.  Advised to avoid chemical exposure at work.  Return precautions given. Final Clinical Impressions(s) / ED Diagnoses   Final diagnoses:  Rash and nonspecific skin eruption    ED Discharge Orders        Ordered    predniSONE (DELTASONE) 20 MG tablet     12/23/16 1825       Julianne Rice, MD 12/23/16 1827

## 2018-06-21 IMAGING — DX DG CHEST 2V
2 series · 2 of 2 positions shown · non-contrast
Comparison: 07/20/2013

CLINICAL DATA: Left-sided chest pain

EXAM:
CHEST  2 VIEW

[chest pa]
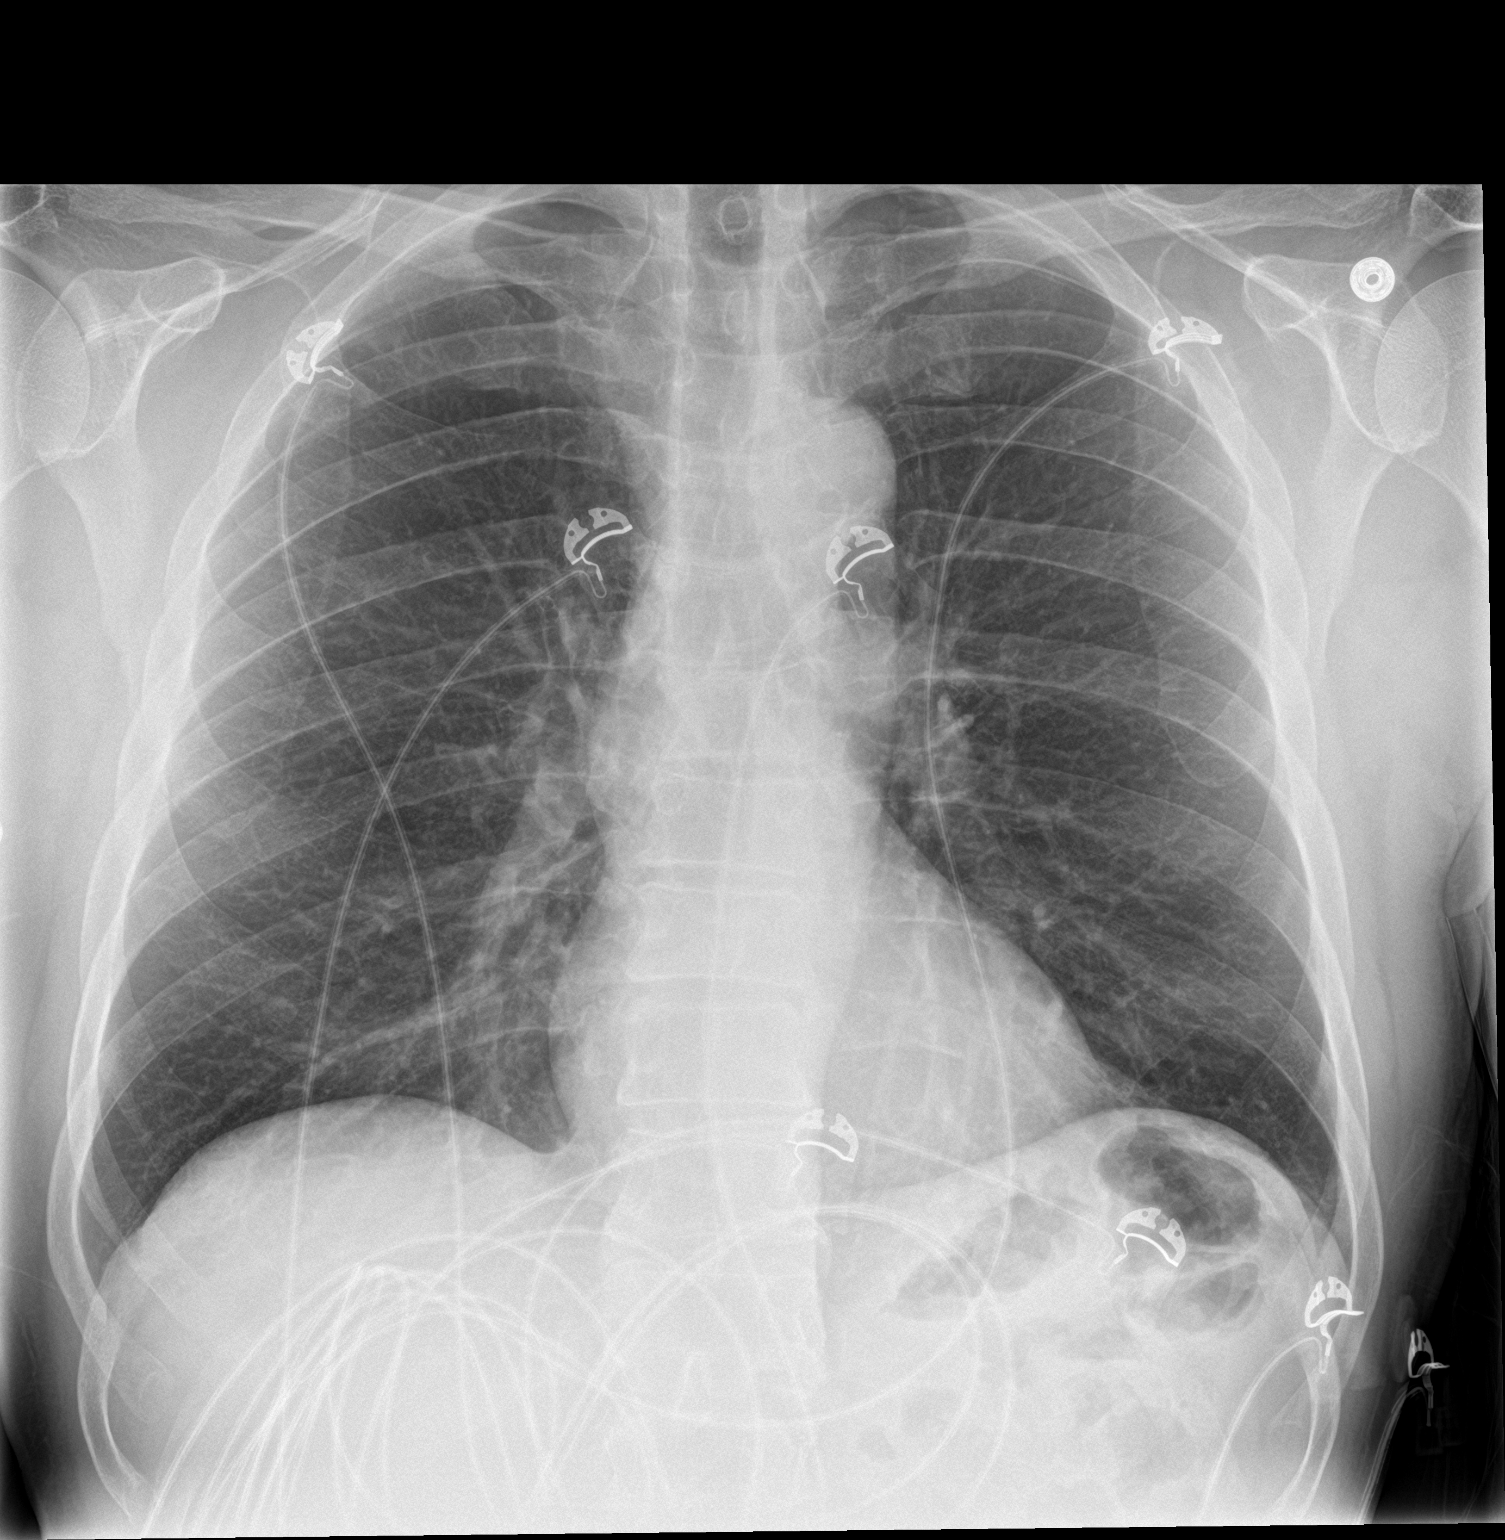

[chest lat]
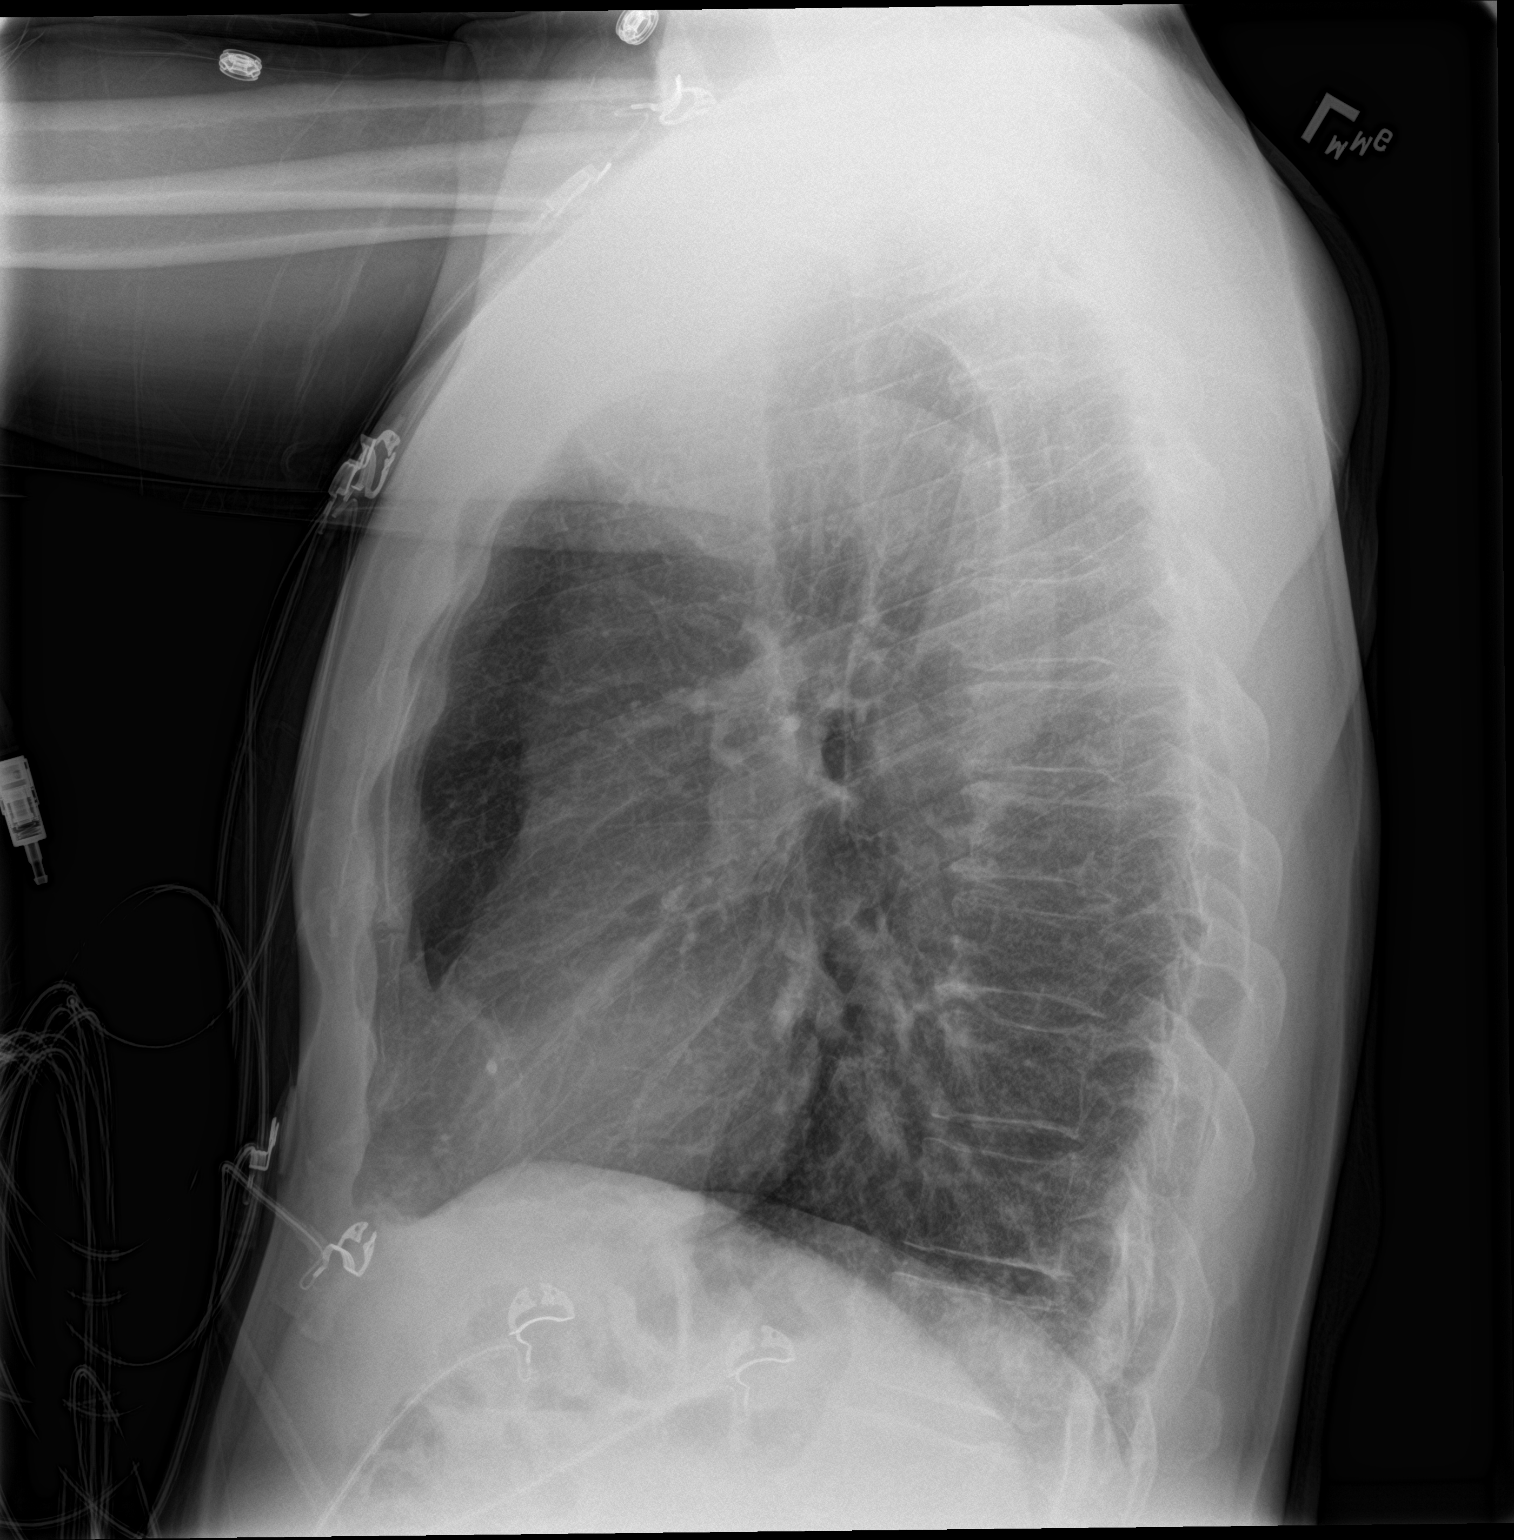

[2 of 2 positions shown; findings below may reference images not displayed]

FINDINGS: The heart size and mediastinal contours are within normal limits.
Both lungs are clear. The visualized skeletal structures are
unremarkable.
IMPRESSION: No active cardiopulmonary disease.

## 2020-07-19 ENCOUNTER — Emergency Department (HOSPITAL_COMMUNITY): Payer: Self-pay

## 2020-07-19 ENCOUNTER — Emergency Department (HOSPITAL_COMMUNITY)
Admission: EM | Admit: 2020-07-19 | Discharge: 2020-07-19 | Disposition: A | Discharge status: Home / Self Care | Payer: Self-pay | Attending: Emergency Medicine | Admitting: Emergency Medicine

## 2020-07-19 ENCOUNTER — Other Ambulatory Visit: Payer: Self-pay

## 2020-07-19 DIAGNOSIS — F17210 Nicotine dependence, cigarettes, uncomplicated: Secondary | ICD-10-CM | POA: Insufficient documentation

## 2020-07-19 DIAGNOSIS — D171 Benign lipomatous neoplasm of skin and subcutaneous tissue of trunk: Secondary | ICD-10-CM | POA: Insufficient documentation

## 2020-07-19 NOTE — ED Provider Notes (Signed)
Emergency Medicine Provider Triage Evaluation Note  Carlos Haley 60 y.o. male was evaluated in triage.  Pt complains of mass to the right side of his back that is been there for 20 years.  He reports today he went to go donate plasma and they told him that he could not because of the mass.  They told him to go get it checked out.  He states it is not painful.  Is not increased in size.  No overlying warmth, erythema.  No fevers.   Review of Systems  Positive: Skin mass Negative: Fevers, warmth, erythema  Physical Exam  BP 134/82   Pulse 70   Temp 98.2 F (36.8 C) (Oral)   Resp 18   Ht 5\' 4"  (1.626 m)   Wt 65.8 kg   SpO2 100%   BMI 24.89 kg/m  Gen:   Awake, no distress   HEENT:  Atraumatic  Resp:  Normal effort  Cardiac:  Normal rate  Abd:   Nondistended, nontender  MSK:   Moves extremities without difficulty  Neuro:  Speech clear   Other:   Soft tissue mass roughly the size of golf ball noted to the right mid back.  It does not go over to the midline.  No overlying warmth, erythema, edema.  Medical Decision Making  Medically screening exam initiated at 11:29 AM  Appropriate orders placed.  Jeralene Huff was informed that the remainder of the evaluation will be completed by another provider, this initial triage assessment does not replace that evaluation. They are counseled that they will need to remain in the ED until the completion of their workup, including full H&P and results of any tests.  Risks of leaving the emergency department prior to completion of treatment were discussed. Patient was advised to inform ED staff if they are leaving before their treatment is complete. The patient acknowledged these risks and time was allowed for questions.     The patient appears stable so that the remainder of the MSE may be completed by another provider.    Clinical Impression  Soft tissue mass   Portions of this note were generated with Dragon dictation software. Dictation errors  may occur despite best attempts at proofreading.     Volanda Napoleon, PA-C 07/19/20 1130    Noemi Chapel, MD 07/19/20 [phone]

## 2020-07-19 NOTE — Discharge Instructions (Addendum)
The spot on your back is a piece of fatty tissue that has been growing, this is called a lipoma, it is benign, you are able to do anything that you want including donate plasma without risk or concern.

## 2020-07-19 NOTE — ED Provider Notes (Signed)
[facility] Provider Note   CSN: [id number] Arrival date & time: 07/19/20  1119     History No chief complaint on file.   Carlos Haley is a 60 y.o. male.  Pt has a lump on the mid left back that is painless, has been there for years and is not red or tender When he went to donate plasma today he was told he needed to be cleared to donate plasma since they first noticed this today.  No fevers no vomiting no other symptoms       Past Medical History:  Diagnosis Date   MI (myocardial infarction) ([place])     There are no problems to display for this patient.   No past surgical history on file.     No family history on file.  Social History   Tobacco Use   Smoking status: Every Day    Pack years: 0.00    Types: Cigarettes   Smokeless tobacco: Never  Substance Use Topics   Alcohol use: Yes    Comment: occ   Drug use: No    Home Medications Prior to Admission medications   Medication Sig Start Date End Date Taking? Authorizing Provider  cholecalciferol (VITAMIN D) 1000 units tablet Take 1,000 Units by mouth daily.    [provider]  famotidine (PEPCID) 20 MG tablet Take 1 tablet (20 mg total) by mouth 2 (two) times daily. Acid reflux 05/28/16   Clayton Bibles, PA-C  Multiple Vitamin (MULTIVITAMIN WITH MINERALS) TABS tablet Take 1 tablet by mouth daily.    [provider]  predniSONE (DELTASONE) 20 MG tablet 3 tabs po day one, then 2 po daily x 4 days 12/24/16   Julianne Rice, MD  triamcinolone cream (KENALOG) 0.1 % Apply 1 application topically 2 (two) times daily. To forearms as needed for itch relief. 10/19/15   Frederica Kuster, PA-C  vitamin B-12 (CYANOCOBALAMIN) 1000 MCG tablet Take 500 mcg by mouth daily.    [provider]    Allergies    Patient has no known allergies.  Review of Systems   Review of Systems  Constitutional:  Negative for fever.  Skin:        Soft tissue mass   Physical  Exam Updated Vital Signs BP (!) 148/96 (BP Location: Right Arm)   Pulse 95   Temp 97.6 F (36.4 C)   Resp 18   SpO2 98%   Physical Exam Vitals and nursing note reviewed.  Constitutional:      Appearance: He is well-developed. He is not diaphoretic.  HENT:     Head: Normocephalic and atraumatic.  Eyes:     General:        Right eye: No discharge.        Left eye: No discharge.     Conjunctiva/sclera: Conjunctivae normal.  Pulmonary:     Effort: Pulmonary effort is normal. No respiratory distress.  Musculoskeletal:     Comments: His mid left thoracic back has a soft nontender fatty lump consistent with a lipoma  Skin:    General: Skin is warm and dry.     Findings: No erythema or rash.  Neurological:     Mental Status: He is alert.     Coordination: Coordination normal.    ED Results / Procedures / Treatments   Labs (all labs ordered are listed, but only abnormal results are displayed) Labs Reviewed - No data to display  EKG None  Radiology DG Chest  2 View  Result Date: 07/19/2020 CLINICAL DATA:  Skin mass EXAM: CHEST - 2 VIEW COMPARISON:  None. FINDINGS: The heart size and mediastinal contours are within normal limits.Subsegmental atelectasis in the lingula.No pleural effusion or pneumothorax.No acute osseous abnormality. Thoracic spondylosis. Soft tissue prominence posteriorly along the mid back corresponding to the palpable metallic marker. No associated soft tissue mineralization. IMPRESSION: Soft tissue prominence posteriorly along the mid back corresponding to the palpable metallic marker. This could be further assessed with nonemergent targeted ultrasound. Subsegmental atelectasis in the lingula. Electronically Signed   By: Maurine Simmering   On: 07/19/2020 12:28    Procedures Procedures   Medications Ordered in ED Medications - No data to display  ED Course  I have reviewed the triage vital signs and the nursing notes.  Pertinent labs & imaging results that were  available during my care of the patient were reviewed by me and considered in my medical decision making (see chart for details).    MDM Rules/Calculators/A&P                          Soft tissue mass consistent with lipoma, appears benign, no signs of infection, vital signs unremarkable, stable for discharge  Final Clinical Impression(s) / ED Diagnoses Final diagnoses:  Lipoma of back    Rx / DC Orders ED Discharge Orders     None        Noemi Chapel, MD 07/19/20 1425

## 2020-07-19 NOTE — ED Triage Notes (Signed)
Pt reports lump on his back x 20 years. Went to donate plasma today and they would not let him donate d/t presence of this mass. Denies pain or increase in size of mass.

## 2022-08-13 IMAGING — CR DG CHEST 2V
2 series · 2 of 2 positions shown · non-contrast
Comparison: None.

CLINICAL DATA: Skin mass

EXAM:
CHEST - 2 VIEW

[chest pa]
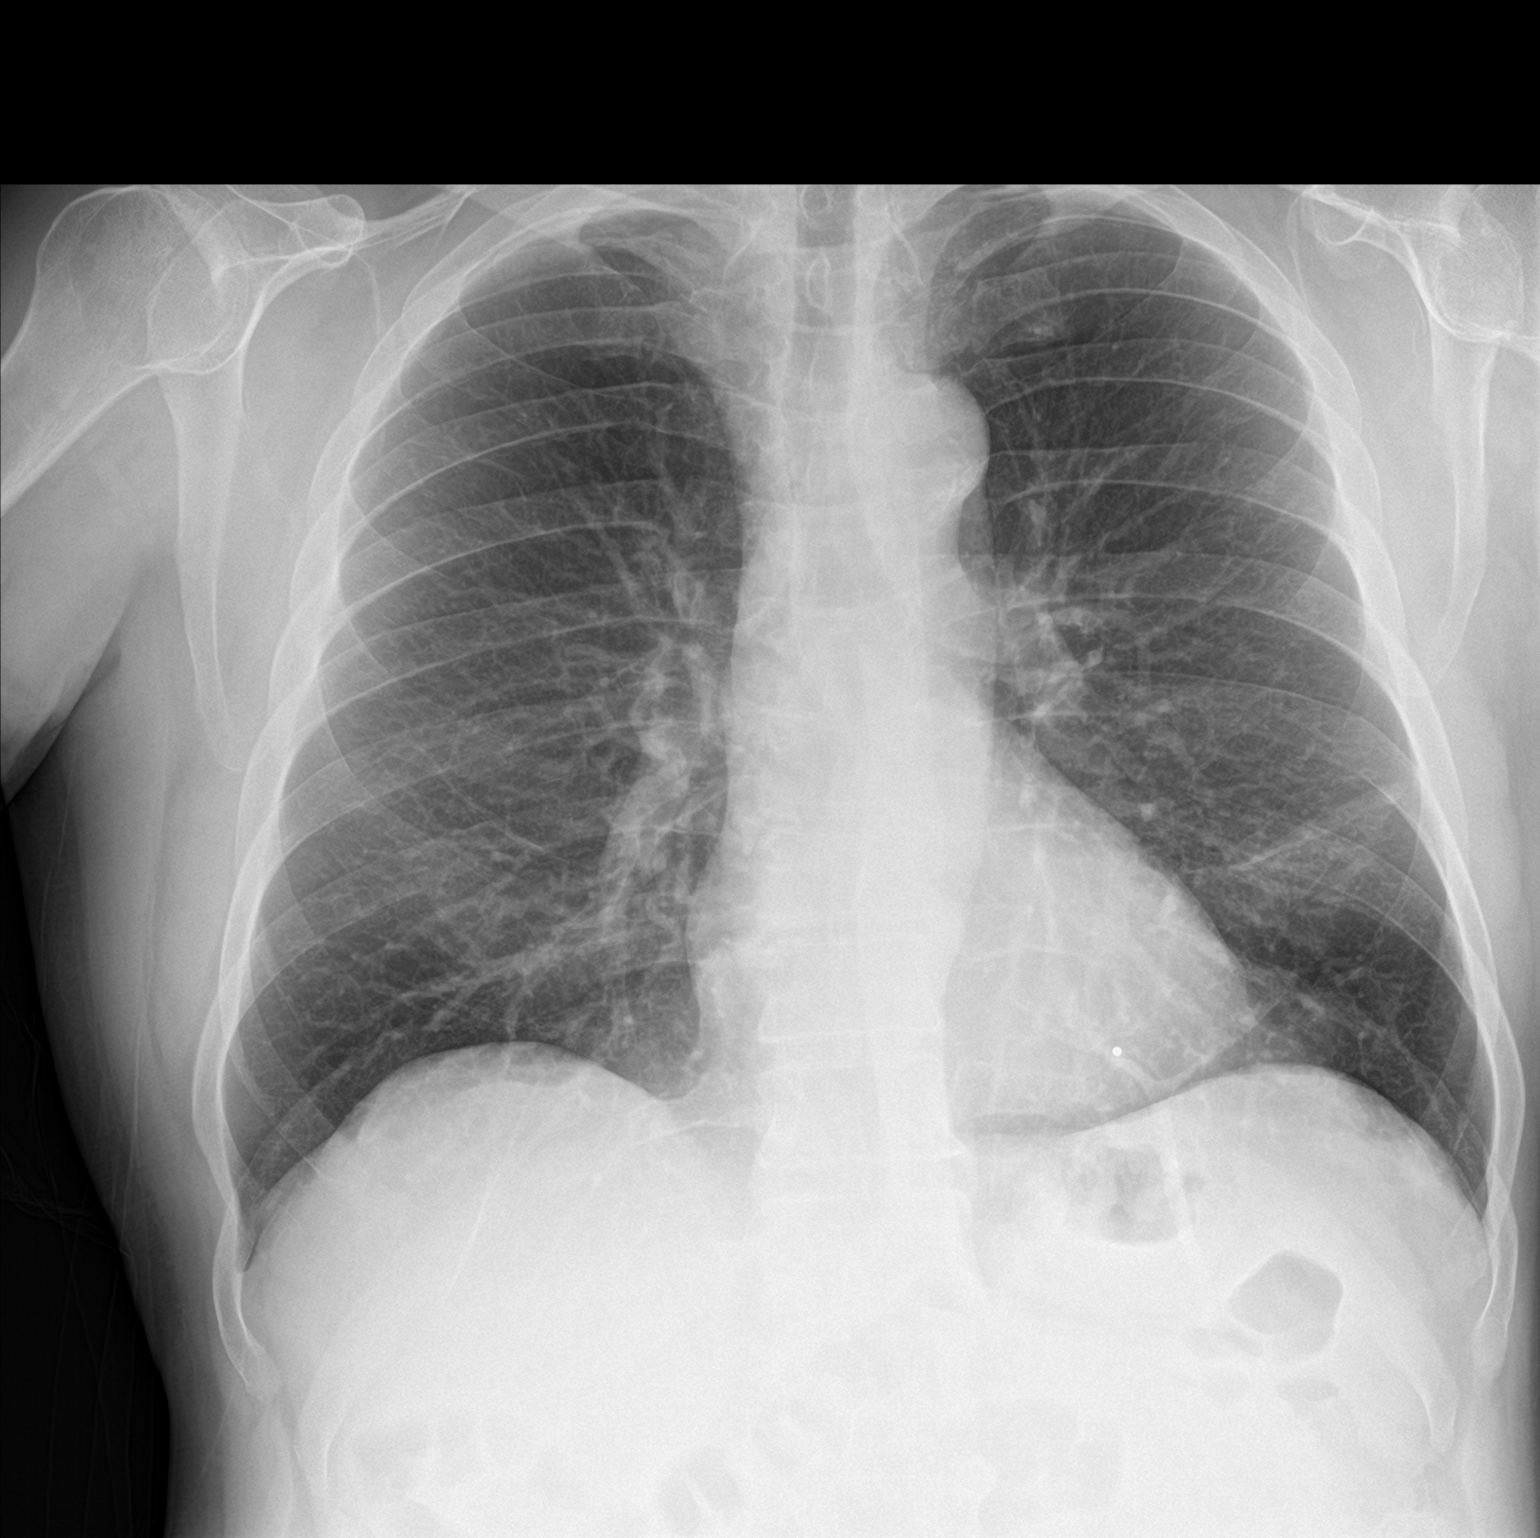

[chest lat]
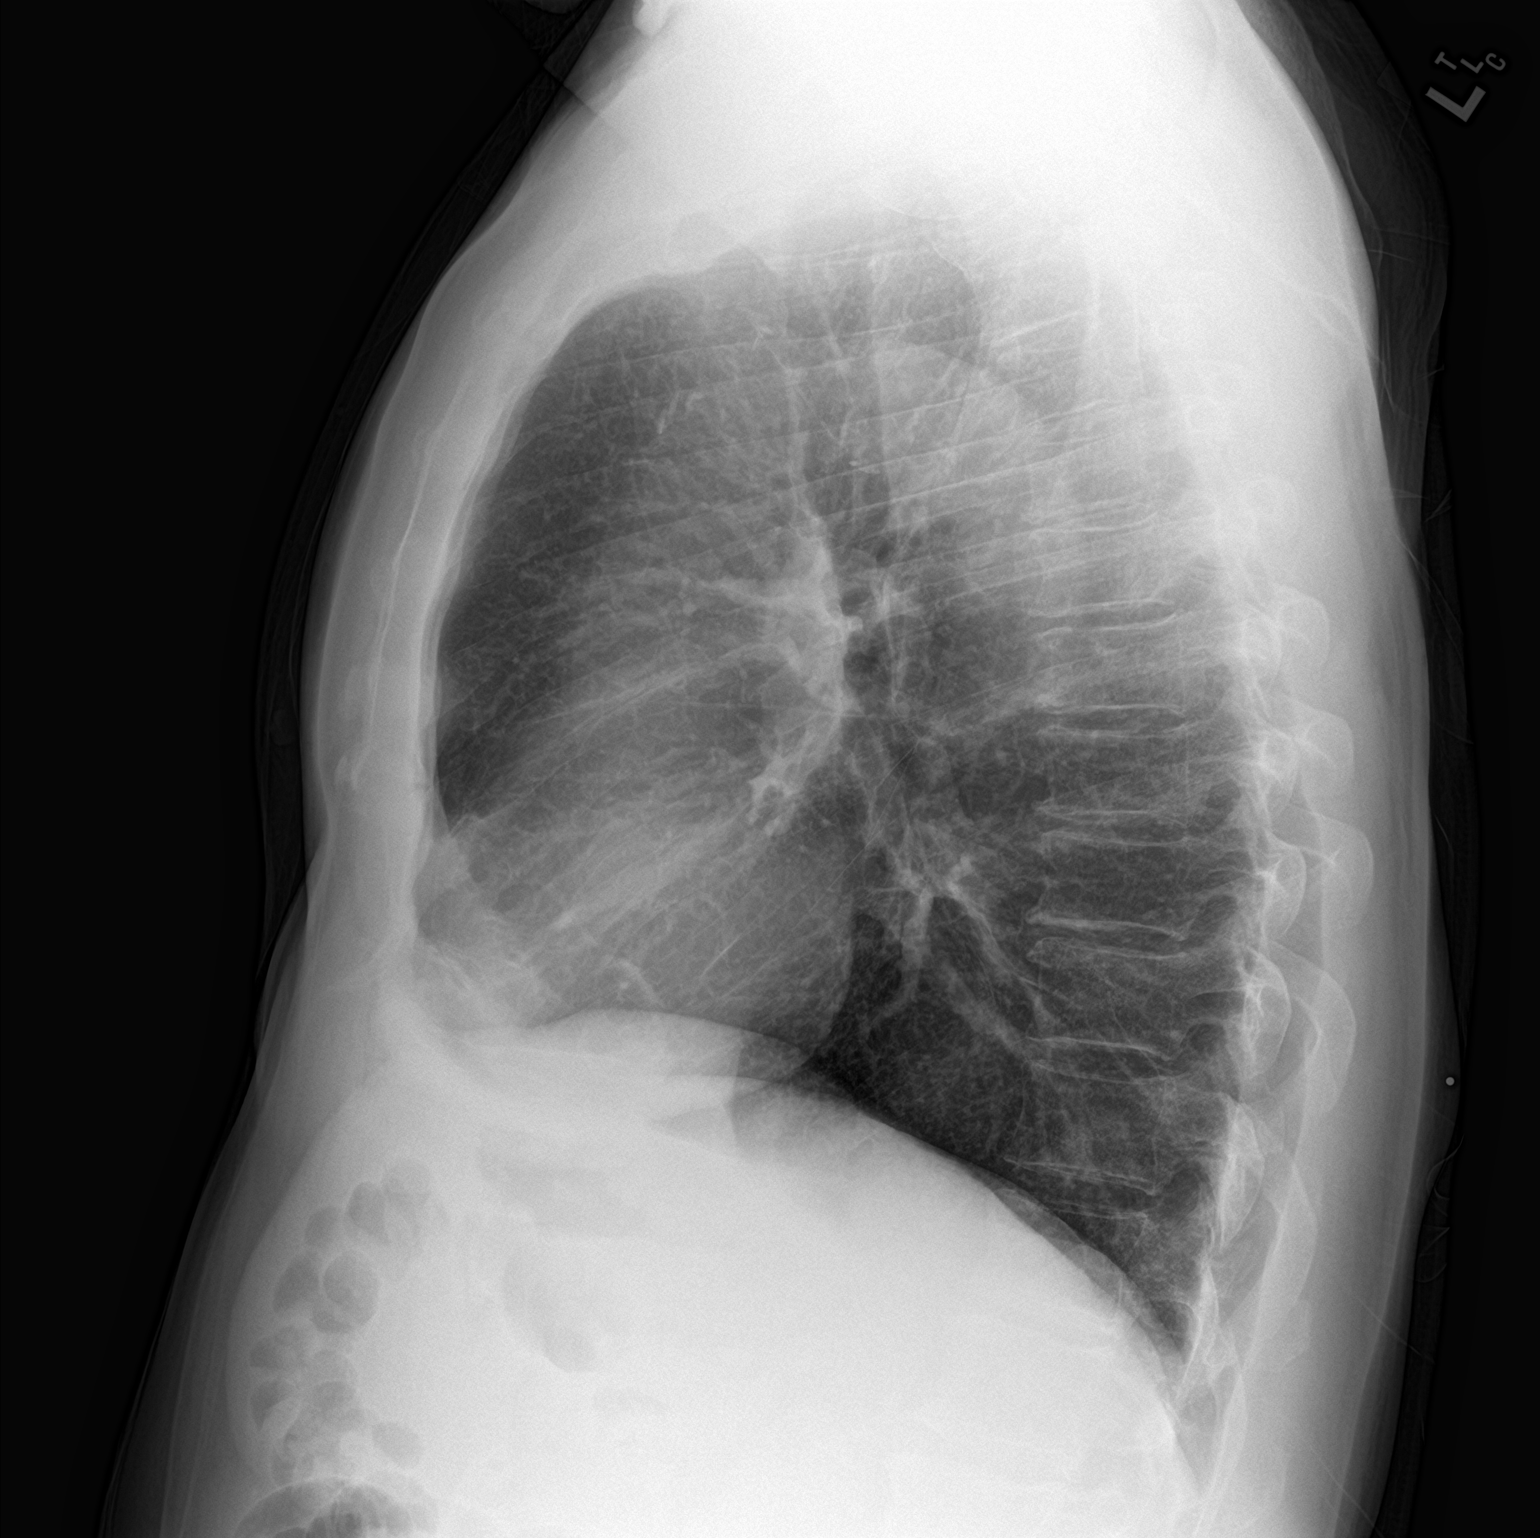

[2 of 2 positions shown; findings below may reference images not displayed]

FINDINGS: The heart size and mediastinal contours are within normal
limits.Subsegmental atelectasis in the lingula.No pleural effusion
or pneumothorax.No acute osseous abnormality. Thoracic spondylosis.
Soft tissue prominence posteriorly along the mid back corresponding
to the palpable metallic marker. No associated soft tissue
mineralization.
IMPRESSION: Soft tissue prominence posteriorly along the mid back corresponding
to the palpable metallic marker. This could be further assessed with
nonemergent targeted ultrasound.

Subsegmental atelectasis in the lingula.
